# Patient Record
Sex: Male | Born: 1955 | Race: White | Hispanic: No | Smoking: Never smoker
Health system: Southern US, Community
[De-identification: ages and names within clinical notes are randomized; demographics above are authoritative.]

## PROBLEM LIST (undated history)

## (undated) HISTORY — PX: VARICOCELE EXCISION: SUR582

---

## 2018-11-24 ENCOUNTER — Emergency Department (HOSPITAL_COMMUNITY)
Admission: EM | Admit: 2018-11-24 | Discharge: 2018-11-24 | Disposition: A | Discharge status: Home / Self Care | Payer: 59 | Attending: Emergency Medicine | Admitting: Emergency Medicine

## 2018-11-24 ENCOUNTER — Encounter (HOSPITAL_COMMUNITY): Payer: Self-pay

## 2018-11-24 ENCOUNTER — Emergency Department (HOSPITAL_COMMUNITY): Payer: 59

## 2018-11-24 DIAGNOSIS — M25512 Pain in left shoulder: Secondary | ICD-10-CM

## 2018-11-24 NOTE — ED Triage Notes (Signed)
Onset yesterday 12:15p pt was putting laptop computer into back of F150 truck, as soon he extended left arm out to push laptop in he felt a pulling sensation.  Pt c/o worse pain when he tries to lift arm.

## 2018-11-24 NOTE — Discharge Instructions (Signed)
Use sling for comfort but try to do range of motion exercises to prevent stiffness Take 2 Aleve twice daily for pain and inflammation Follow up with orthopedics if you are not improving

## 2018-11-24 NOTE — ED Provider Notes (Signed)
[facility] EMERGENCY DEPARTMENT Provider Note   CSN: [id number] Arrival date & time: 11/24/18  2037     History   Chief Complaint Chief Complaint  Patient presents with  . Shoulder Injury    HPI Neil Long is a 63 y.o. male who presents with left shoulder pain.  He is left-hand dominant.  No significant past medical history.  He states that yesterday he was in [place] and he was pushing a monitor onto his truck and once his arm was fully extended he felt a sudden onset of pain in his left shoulder.  Pain is sharp and severe in nature.  It was severe for about 15 minutes and then eased off however anytime he would lift his arm the pain would return.  He drove back to [facility] yesterday and was able unable to use his left arm for driving.  Last night he had difficulty sleeping due to the pain.  He took a sleeping pill but has not taken any pain medicine. He decided to come to the ED tonight because he knew it was going to be hard to sleep again so wanted to be checked out. No weakness, numbness, tingling of the arm. No chest pain.  HPI  No past medical history on file.  There are no active problems to display for this patient.    The histories are not reviewed yet. Please review them in the "History" navigator section and refresh this SmartLink.      Home Medications    Prior to Admission medications   Not on File    Family History No family history on file.  Social History Social History   Tobacco Use  . Smoking status: Never Smoker  . Smokeless tobacco: Never Used  Substance Use Topics  . Alcohol use: Not Currently  . Drug use: Never     Allergies   Patient has no known allergies.   Review of Systems Review of Systems  Cardiovascular: Negative for chest pain.  Musculoskeletal: Positive for arthralgias.  Neurological: Negative for weakness and numbness.     Physical Exam Updated Vital Signs BP (!) 147/87 (BP Location: Right  Arm)   Pulse 71   Temp (!) 97.4 F (36.3 C) (Oral)   Resp 19   SpO2 100%   Physical Exam Vitals signs and nursing note reviewed.  Constitutional:      General: He is not in acute distress.    Appearance: Normal appearance. He is well-developed.     Comments: Calm, cooperative. Pleasant  HENT:     Head: Normocephalic and atraumatic.  Eyes:     General: No scleral icterus.       Right eye: No discharge.        Left eye: No discharge.     Conjunctiva/sclera: Conjunctivae normal.     Pupils: Pupils are equal, round, and reactive to light.  Neck:     Musculoskeletal: Normal range of motion.  Cardiovascular:     Rate and Rhythm: Normal rate.  Pulmonary:     Effort: Pulmonary effort is normal. No respiratory distress.  Abdominal:     General: There is no distension.  Musculoskeletal:     Comments: Left shoulder: No obvious swelling, deformity, or warmth. Tenderness to palpation of biceps tendon. Pain with shoulder flexion and abduction. He is able to hold arm at 90 degrees of flexion without weakness. 5/5 grip strength. N/V intact.   Skin:    General: Skin is warm and dry.  Neurological:     Mental Status: He is alert and oriented to person, place, and time.  Psychiatric:        Behavior: Behavior normal.      ED Treatments / Results  Labs (all labs ordered are listed, but only abnormal results are displayed) Labs Reviewed - No data to display  EKG None  Radiology Dg Shoulder Left  Result Date: 11/24/2018 CLINICAL DATA:  Left shoulder pain with lifting EXAM: LEFT SHOULDER - 2+ VIEW COMPARISON:  None. FINDINGS: No fracture or malalignment. Mild AC joint and glenohumeral degenerative change. Left lung is clear. IMPRESSION: No acute osseous abnormality Electronically Signed   By: Jasmine PangKim  Fujinaga M.D.   On: 11/24/2018 21:13    Procedures Procedures (including critical care time)  Medications Ordered in ED Medications - No data to display   Initial Impression /  Assessment and Plan / ED Course  I have reviewed the triage vital signs and the nursing notes.  Pertinent labs & imaging results that were available during my care of the patient were reviewed by me and considered in my medical decision making (see chart for details).  63 year old male presents with acute shoulder pain after flexion of his shoulder yesterday. He is hypertensive but otherwise vitals are normal. He has tenderness over the biceps tendon. Drop arm test is negative so doubt complete tendon rupture. Will place in sling and advised take NSAIDs for pain. He was given referral to ortho if symptoms aren't improving.  Final Clinical Impressions(s) / ED Diagnoses   Final diagnoses:  Acute pain of left shoulder    ED Discharge Orders    None       Bethel BornGekas, [place] Marie, PA-C 11/24/18 2148    Tegeler, Canary Brimhristopher J, MD 11/25/18 [phone]

## 2018-12-17 ENCOUNTER — Other Ambulatory Visit: Payer: Self-pay | Admitting: Orthopedic Surgery

## 2018-12-17 DIAGNOSIS — M25512 Pain in left shoulder: Secondary | ICD-10-CM

## 2018-12-19 ENCOUNTER — Ambulatory Visit
Admission: RE | Admit: 2018-12-19 | Discharge: 2018-12-19 | Disposition: A | Discharge status: Home / Self Care | Payer: 59 | Source: Ambulatory Visit | Attending: Orthopedic Surgery | Admitting: Orthopedic Surgery

## 2018-12-19 DIAGNOSIS — M25512 Pain in left shoulder: Secondary | ICD-10-CM

## 2020-06-18 IMAGING — CR DG SHOULDER 2+V*L*
2 series · 2 of 2 positions shown · non-contrast
Comparison: None.

CLINICAL DATA: Left shoulder pain with lifting

EXAM:
LEFT SHOULDER - 2+ VIEW

[shoulder grashey]
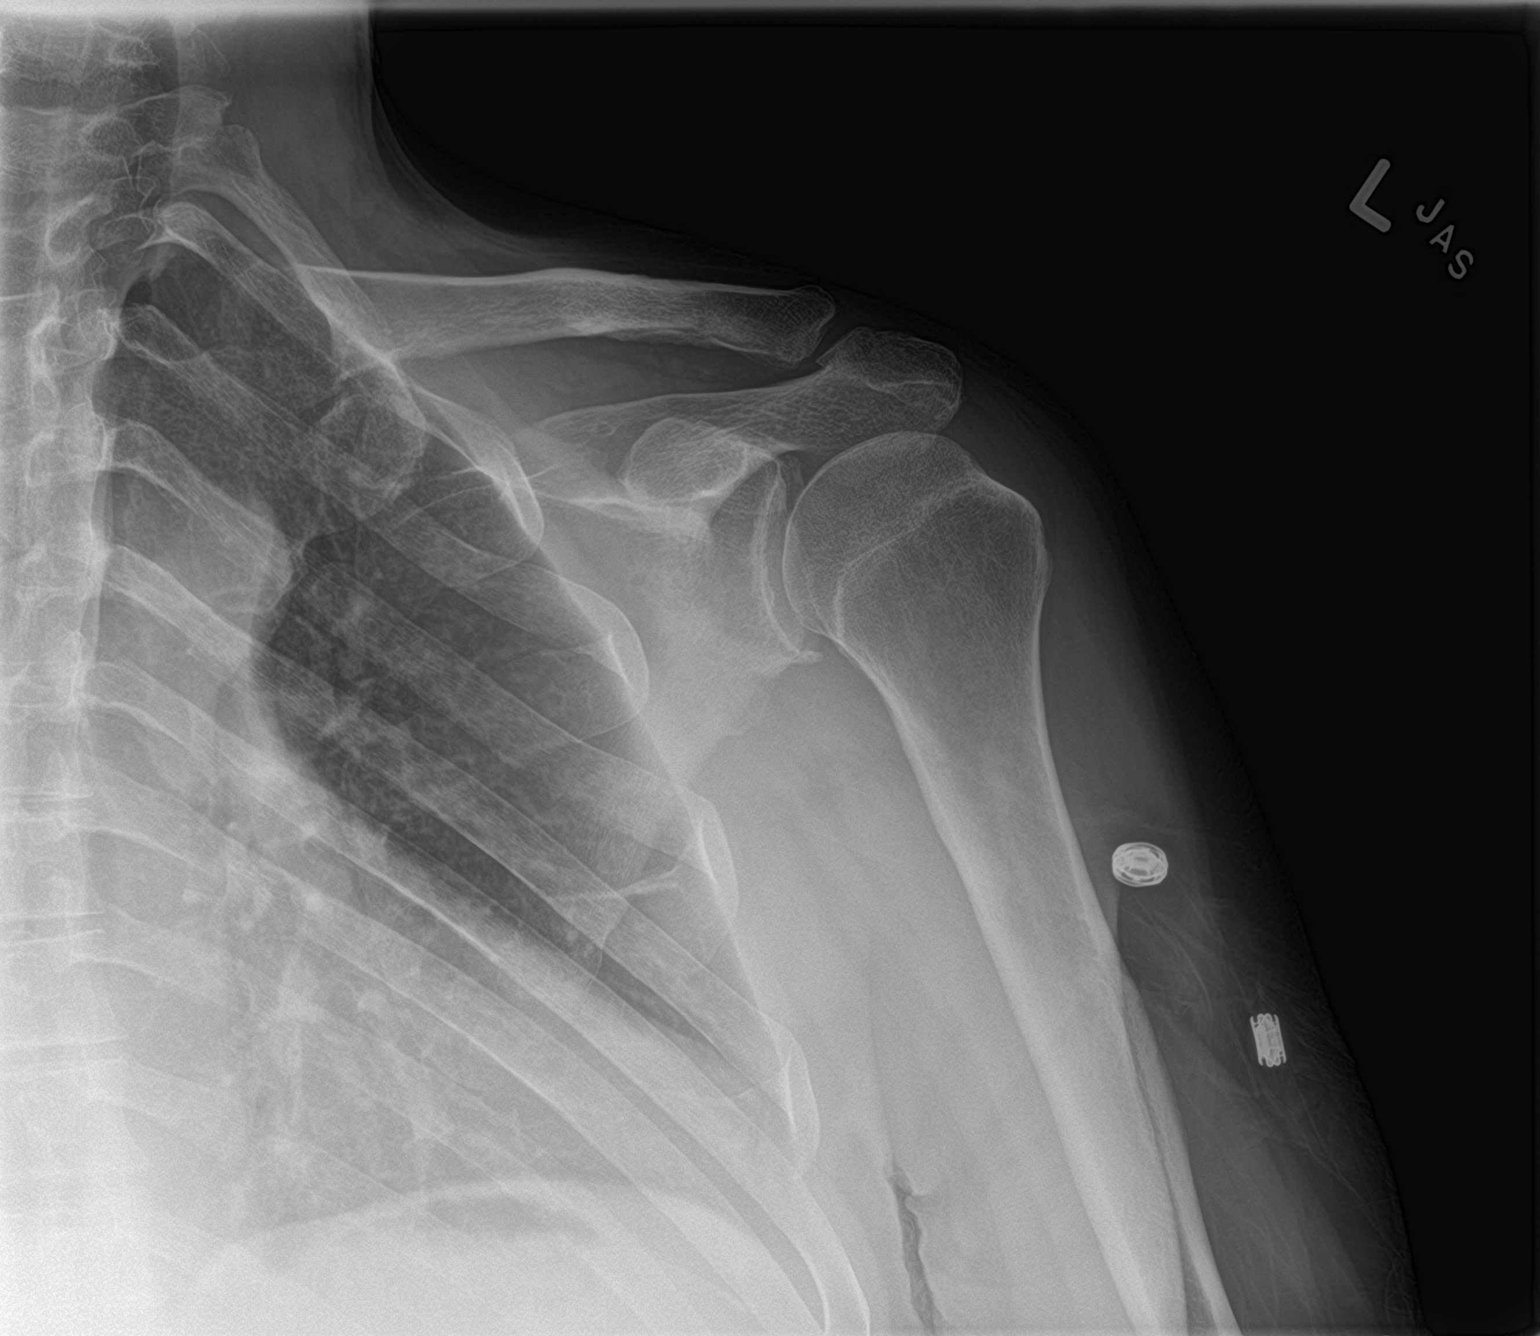

[shoulder y view]
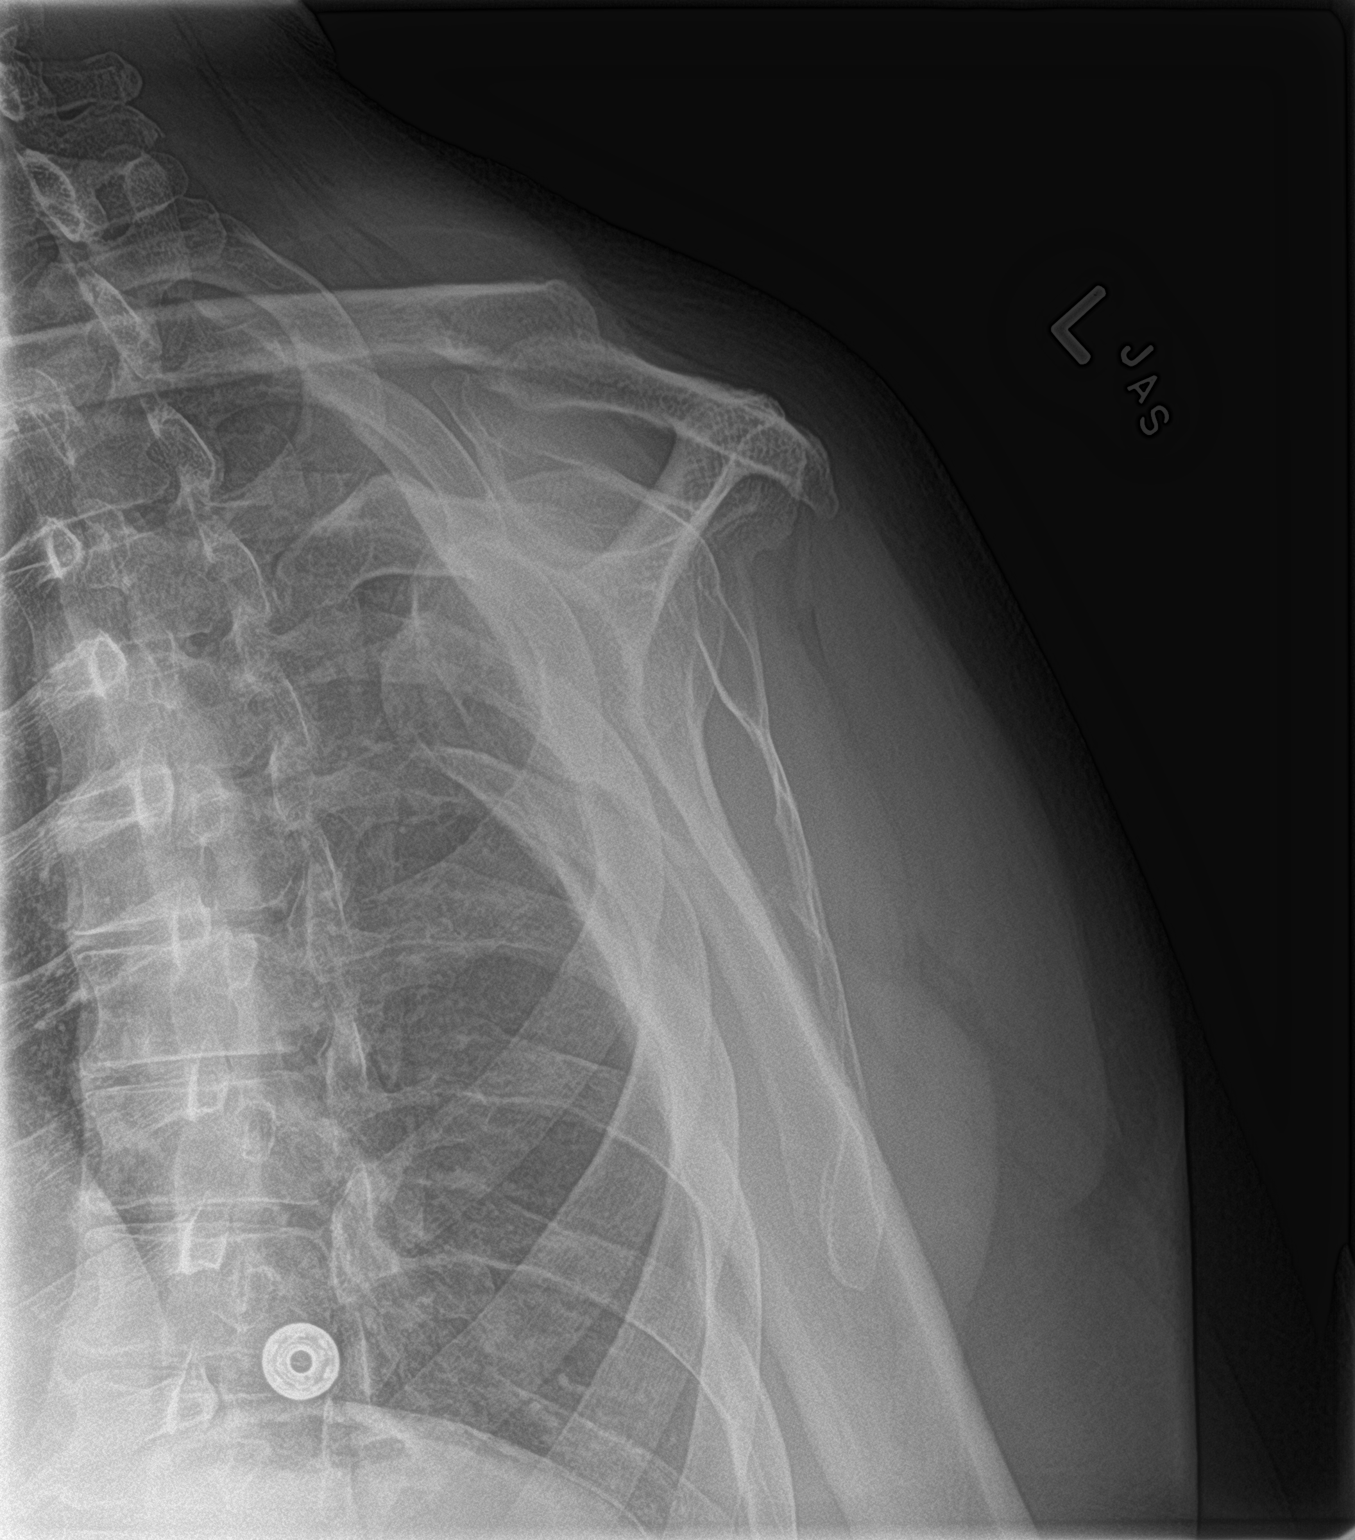

[2 of 2 positions shown; findings below may reference images not displayed]

FINDINGS: No fracture or malalignment. Mild AC joint and glenohumeral
degenerative change. Left lung is clear.
IMPRESSION: No acute osseous abnormality
